# Patient Record
Sex: Male | Born: 1985 | Race: Black or African American | Hispanic: No | Marital: Single | Smoking: Current every day smoker
Health system: Southern US, Community
[De-identification: ages and names within clinical notes are randomized; demographics above are authoritative.]

## PROBLEM LIST (undated history)

## (undated) DIAGNOSIS — I10 Essential (primary) hypertension: Secondary | ICD-10-CM

## (undated) DIAGNOSIS — J45909 Unspecified asthma, uncomplicated: Secondary | ICD-10-CM

---
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
## 2013-12-22 ENCOUNTER — Emergency Department (HOSPITAL_COMMUNITY)
Admission: EM | Admit: 2013-12-22 | Discharge: 2013-12-22 | Disposition: A | Discharge status: Home / Self Care | Payer: Self-pay | Attending: Emergency Medicine | Admitting: Emergency Medicine

## 2013-12-22 ENCOUNTER — Encounter (HOSPITAL_COMMUNITY): Payer: Self-pay | Admitting: Emergency Medicine

## 2013-12-22 DIAGNOSIS — H538 Other visual disturbances: Secondary | ICD-10-CM | POA: Insufficient documentation

## 2013-12-22 DIAGNOSIS — H53149 Visual discomfort, unspecified: Secondary | ICD-10-CM | POA: Insufficient documentation

## 2013-12-22 DIAGNOSIS — H5710 Ocular pain, unspecified eye: Secondary | ICD-10-CM | POA: Insufficient documentation

## 2013-12-22 DIAGNOSIS — I10 Essential (primary) hypertension: Secondary | ICD-10-CM | POA: Insufficient documentation

## 2013-12-22 DIAGNOSIS — H5789 Other specified disorders of eye and adnexa: Secondary | ICD-10-CM | POA: Insufficient documentation

## 2013-12-22 DIAGNOSIS — F17200 Nicotine dependence, unspecified, uncomplicated: Secondary | ICD-10-CM | POA: Insufficient documentation

## 2013-12-22 DIAGNOSIS — J45909 Unspecified asthma, uncomplicated: Secondary | ICD-10-CM | POA: Insufficient documentation

## 2013-12-22 HISTORY — DX: Unspecified asthma, uncomplicated: J45.909

## 2013-12-22 HISTORY — DX: Essential (primary) hypertension: I10

## 2013-12-22 MED ORDER — HYDROCODONE-ACETAMINOPHEN 5-325 MG PO TABS
1.0000 | ORAL_TABLET | Freq: Once | ORAL | Status: AC
  Administered 2013-12-22: 1 via ORAL
  Filled 2013-12-22: qty 1

## 2013-12-22 MED ORDER — HYDROCODONE-ACETAMINOPHEN 5-325 MG PO TABS: ORAL_TABLET | ORAL | Status: DC

## 2013-12-22 MED ORDER — FLUORESCEIN SODIUM 1 MG OP STRP
1.0000 | ORAL_STRIP | Freq: Once | OPHTHALMIC | Status: AC
  Administered 2013-12-22: 1 via OPHTHALMIC
  Filled 2013-12-22: qty 1

## 2013-12-22 MED ORDER — ERYTHROMYCIN 5 MG/GM OP OINT
TOPICAL_OINTMENT | Freq: Once | OPHTHALMIC | Status: AC
  Administered 2013-12-22: 21:00:00 via OPHTHALMIC
  Filled 2013-12-22: qty 1

## 2013-12-22 MED ORDER — TETRACAINE HCL 0.5 % OP SOLN
1.0000 [drp] | Freq: Once | OPHTHALMIC | Status: AC
  Administered 2013-12-22: 1 [drp] via OPHTHALMIC
  Filled 2013-12-22: qty 2

## 2013-12-22 NOTE — Discharge Instructions (Signed)
Please read and follow all provided instructions.  Your diagnoses today include:  1. Eye pain    Tests performed today include:  Visual acuity testing to check your vision  Fluorescein dye examination to look for scratches on your eye  Tonometry to check the pressure inside of your eye  Vital signs. See below for your results today.   Medications prescribed:   Vicodin (hydrocodone/acetaminophen) - narcotic pain medication  DO NOT drive or perform any activities that require you to be awake and alert because this medicine can make you drowsy. BE VERY CAREFUL not to take multiple medicines containing Tylenol (also called acetaminophen). Doing so can lead to an overdose which can damage your liver and cause liver failure and possibly death.   Erythromycin  - antibiotic eye ointment  Use this medication as follows:  Apply 1/4" of the antibiotic ointment to affected eye up to 6 times a day while awake for 7 days  Take any prescribed medications only as directed.  Home care instructions:  Follow any educational materials contained in this packet. If you wear contact lenses, do not use them until your eye caregiver approves. Follow-up care is necessary to be sure the infection is healing if not completely resolved in 2-3 days. See your caregiver or eye specialist as suggested for followup.   If you have an eye infection, wash your hands often as this is very contagious and is easily spread from person to person.   Follow-up instructions: Please follow-up the opthalmologist listed tomorrow for further evaluation of your symptoms.  If you do not have a primary care doctor -- see below for referral information.   Return instructions:   Please return to the Emergency Department if you experience worsening symptoms.   Please return immediately if you develop severe pain, pus drainage, new change in vision, or fever.  Please return if you have any other emergent  concerns.  Additional Information:  Your vital signs today were: BP 173/121   Pulse 109   Temp(Src) 97.8 F (36.6 C) (Oral)   Resp 20   Wt 352 lb 8 oz (159.893 kg)   SpO2 97% If your blood pressure (BP) was elevated above 135/85 this visit, please have this repeated by your doctor within one month. ---------------

## 2013-12-22 NOTE — ED Notes (Signed)
C/o L eye pain and blurred vision since yesterday.

## 2013-12-22 NOTE — ED Provider Notes (Signed)
CSN: [id number]     Arrival date & time 12/22/13  1942 History  This chart was scribed for non-physician practitioner Rhea Bleacher, PA working with Layla Maw Ward, DO by Elveria Rising, ED Scribe. This patient was seen in room TR04C/TR04C and the patient's care was started at 8:14 PM.   Chief Complaint  Patient presents with  . Eye Pain   Patient is a 28 y.o. male presenting with eye pain. The history is provided by the patient. No language interpreter was used.  Eye Pain Pertinent negatives include no chest pain, no abdominal pain and no headaches.  Eye Pain Pertinent negatives include no abdominal pain, chest pain, coughing, fever, headaches, myalgias, nausea, rash, sore throat or vomiting.   HPI Comments: DVID PENDRY is a 28 y.o. male who presents to the Emergency Department complaining of sudden left eye pain and redness, onset 2 days ago. Patient noticed pain upon waking up 2 days ago, but says he was experiencing minimal pain before before bed. Patient has been using Clear Eyes to reduce eye redness and ibuprofen to manage his pain. Patient admits that pain is resolving. Patient currently complaining of blurred vision, pain around eye and mild light sensitivity. No history of autoimmune disease.   Past Medical History  Diagnosis Date  . Asthma   . Hypertension    History reviewed. No pertinent past surgical history. No family history on file. History  Substance Use Topics  . Smoking status: Current Every Day Smoker  . Smokeless tobacco: Not on file  . Alcohol Use: Yes    Review of Systems  Constitutional: Negative for fever.  HENT: Negative for rhinorrhea and sore throat.   Eyes: Positive for photophobia, pain, discharge, redness and visual disturbance. Negative for itching.  Respiratory: Negative for cough.   Cardiovascular: Negative for chest pain.  Gastrointestinal: Negative for nausea, vomiting, abdominal pain and diarrhea.  Genitourinary: Negative for dysuria.   Musculoskeletal: Negative for myalgias.  Skin: Negative for rash.  Neurological: Negative for headaches.      Allergies  Review of patient's allergies indicates not on file.  Home Medications  No current outpatient prescriptions on file. BP 173/121  Pulse 109  Temp(Src) 97.8 F (36.6 C) (Oral)  Resp 20  Wt 352 lb 8 oz (159.893 kg)  SpO2 97% Physical Exam  Nursing note and vitals reviewed. Constitutional: He is oriented to person, place, and time. He appears well-developed and well-nourished. No distress.  HENT:  Head: Normocephalic and atraumatic.  Eyes: Right eye exhibits no chemosis, no discharge and no exudate. No foreign body present in the right eye. Left eye exhibits chemosis. Left eye exhibits no discharge and no exudate. No foreign body present in the left eye. Right conjunctiva is not injected. Right conjunctiva has no hemorrhage. Left conjunctiva is injected. Left conjunctiva has no hemorrhage. Right eye exhibits normal extraocular motion. Left eye exhibits normal extraocular motion. Right pupil is round and reactive. Left pupil is round and reactive. Pupils are equal.  Slit lamp exam:      The left eye shows no corneal abrasion, no corneal ulcer, no foreign body, no fluorescein uptake and no anterior chamber bulge.    Left eye: Severe conjunctival injection.   Ecchymosis laterally.  Neck: Neck supple. No tracheal deviation present.  Cardiovascular: Normal rate.   Pulmonary/Chest: Effort normal. No respiratory distress.  Musculoskeletal: Normal range of motion.  Neurological: He is alert and oriented to person, place, and time.  Skin: Skin is warm and dry.  Psychiatric: He has a normal mood and affect. His behavior is normal.    ED Course  Procedures (including critical care time) DIAGNOSTIC STUDIES: Oxygen Saturation is 97% on room air, normal by my interpretation.    COORDINATION OF CARE: 8:20 PM- Pt advised of plan for treatment and pt agrees. He has no  allergies.   8:48 PM - Patient denies causative events to onset. Patient being discharged with medication to manage pain. Patient advised to follow up with eye doctor tomorrow.   Labs Review Labs Reviewed - No data to display Imaging Review No results found.  EKG Interpretation   None      Vital signs reviewed and are as follows: Filed Vitals:   12/22/13 2135  BP: 147/87  Pulse: 93  Temp:   Resp: 20   Two drops of tetracaine/proparacaine instilled into affected eye.   Fluorescein strip applied to affected eye. Wood's lamp used to assess for corneal abrasion. No corneal abrasion identified. No foreign bodies noted. No visible hyphema.   Tonometry performed. Left eye pressure: 18, 17  Patient tolerated procedure well without immediate complication.   Patient d/w Dr. Elesa MassedWard. Plan reviewed.   Feel patient is safe for discharge to home with ophthalmology followup tomorrow. Prior to discharge, I sat with patient and explained that while this may be a severe conjunctivitis, scleritis remains in the differential and this would require steroid treatment and close ophthalmology followup. Patient verbalizes understanding and states that he will followup tomorrow.  Patient counseled on use of narcotic pain medications. Counseled not to combine these medications with others containing tylenol. Urged not to drink alcohol, drive, or perform any other activities that requires focus while taking these medications. The patient verbalizes understanding and agrees with the plan.  Patient urged to return with worsening symptoms or other concerns. Patient verbalized understanding and agrees with plan.    MDM   Final diagnoses:  Eye pain   Patient with painful red eye, chemosis. Vision is not severely affected. Possible conjunctivitis vs scleritis. Episcleritis felt less likely (patient describes significant pain). No foreign bodies noted. No surrounding erythema, swelling, vision changes/loss  suspicious for orbital or periorbital cellulitis. No signs of iritis. No signs of glaucoma, intraocular pressures normal. No symptoms of retinal detachment. Feel patient needs urgent ophthalmology followup and referrals given.  I personally performed the services described in this documentation, which was scribed in my presence. The recorded information has been reviewed and is accurate.    Renne CriglerJoshua Orena Cavazos, PA-C 12/22/13 2225

## 2013-12-23 NOTE — ED Provider Notes (Signed)
Medical screening examination/treatment/procedure(s) were performed by non-physician practitioner and as supervising physician I was immediately available for consultation/collaboration.  EKG Interpretation   None         Kalia Vahey N Makalyn Lennox, DO 12/23/13 0222 

## 2019-02-22 ENCOUNTER — Emergency Department (HOSPITAL_COMMUNITY): Payer: Self-pay

## 2019-02-22 ENCOUNTER — Encounter (HOSPITAL_COMMUNITY): Payer: Self-pay | Admitting: Emergency Medicine

## 2019-02-22 ENCOUNTER — Emergency Department (HOSPITAL_COMMUNITY)
Admission: EM | Admit: 2019-02-22 | Discharge: 2019-02-22 | Disposition: A | Discharge status: Home / Self Care | Payer: Self-pay | Attending: Emergency Medicine | Admitting: Emergency Medicine

## 2019-02-22 DIAGNOSIS — K122 Cellulitis and abscess of mouth: Secondary | ICD-10-CM | POA: Insufficient documentation

## 2019-02-22 DIAGNOSIS — F17200 Nicotine dependence, unspecified, uncomplicated: Secondary | ICD-10-CM | POA: Insufficient documentation

## 2019-02-22 DIAGNOSIS — J45909 Unspecified asthma, uncomplicated: Secondary | ICD-10-CM | POA: Insufficient documentation

## 2019-02-22 DIAGNOSIS — Z7984 Long term (current) use of oral hypoglycemic drugs: Secondary | ICD-10-CM | POA: Insufficient documentation

## 2019-02-22 DIAGNOSIS — I10 Essential (primary) hypertension: Secondary | ICD-10-CM | POA: Insufficient documentation

## 2019-02-22 DIAGNOSIS — F1410 Cocaine abuse, uncomplicated: Secondary | ICD-10-CM | POA: Insufficient documentation

## 2019-02-22 DIAGNOSIS — Z79899 Other long term (current) drug therapy: Secondary | ICD-10-CM | POA: Insufficient documentation

## 2019-02-22 LAB — I-STAT CREATININE, ED: Creatinine, Ser: 0.7 mg/dL (ref 0.61–1.24)

## 2019-02-22 LAB — COMPREHENSIVE METABOLIC PANEL
ALT: 27 U/L (ref 0–44)
AST: 17 U/L (ref 15–41)
Albumin: 4 g/dL (ref 3.5–5.0)
Alkaline Phosphatase: 88 U/L (ref 38–126)
Anion gap: 14 (ref 5–15)
BUN: 12 mg/dL (ref 6–20)
CO2: 22 mmol/L (ref 22–32)
Calcium: 9.3 mg/dL (ref 8.9–10.3)
Chloride: 99 mmol/L (ref 98–111)
Creatinine, Ser: 0.77 mg/dL (ref 0.61–1.24)
GFR calc Af Amer: >60 mL/min (ref >60)
GFR calc non Af Amer: >60 mL/min (ref >60)
Glucose, Bld: 309 mg/dL — ABNORMAL HIGH (ref 70–99)
Potassium: 3.7 mmol/L (ref 3.5–5.1)
Sodium: 135 mmol/L (ref 135–145)
Total Bilirubin: 1.7 mg/dL — ABNORMAL HIGH (ref 0.3–1.2)
Total Protein: 8.1 g/dL (ref 6.5–8.1)

## 2019-02-22 LAB — CBC WITH DIFFERENTIAL/PLATELET
Abs Immature Granulocytes: 0.06 10*3/uL (ref 0.00–0.07)
Basophils Absolute: 0.1 10*3/uL (ref 0.0–0.1)
Basophils Relative: 1 %
Eosinophils Absolute: 0 10*3/uL (ref 0.0–0.5)
Eosinophils Relative: 0 %
HCT: 42.6 % (ref 39.0–52.0)
Hemoglobin: 14.7 g/dL (ref 13.0–17.0)
Immature Granulocytes: 1 %
Lymphocytes Relative: 25 %
Lymphs Abs: 2.1 10*3/uL (ref 0.7–4.0)
MCH: 28.1 pg (ref 26.0–34.0)
MCHC: 34.5 g/dL (ref 30.0–36.0)
MCV: 81.5 fL (ref 80.0–100.0)
Monocytes Absolute: 0.8 10*3/uL (ref 0.1–1.0)
Monocytes Relative: 10 %
Neutro Abs: 5.4 10*3/uL (ref 1.7–7.7)
Neutrophils Relative %: 63 %
Platelets: 258 10*3/uL (ref 150–400)
RBC: 5.23 MIL/uL (ref 4.22–5.81)
RDW: 12.7 % (ref 11.5–15.5)
WBC: 8.5 10*3/uL (ref 4.0–10.5)
nRBC: 0 % (ref 0.0–0.2)

## 2019-02-22 MED ORDER — IBUPROFEN 600 MG PO TABS: 600.0000 mg | ORAL_TABLET | Freq: Four times a day (QID) | ORAL | 0 refills | Status: DC | PRN

## 2019-02-22 MED ORDER — KETOROLAC TROMETHAMINE 15 MG/ML IJ SOLN
30.0000 mg | Freq: Once | INTRAMUSCULAR | Status: AC
  Administered 2019-02-22: 30 mg via INTRAVENOUS
  Filled 2019-02-22: qty 2

## 2019-02-22 MED ORDER — SODIUM CHLORIDE 0.9 % IV SOLN
3.0000 g | Freq: Once | INTRAVENOUS | Status: AC
  Administered 2019-02-22: 3 g via INTRAVENOUS
  Filled 2019-02-22: qty 3

## 2019-02-22 MED ORDER — RACEPINEPHRINE HCL 2.25 % IN NEBU
0.5000 mL | INHALATION_SOLUTION | Freq: Once | RESPIRATORY_TRACT | Status: AC
  Administered 2019-02-22: 0.5 mL via RESPIRATORY_TRACT
  Filled 2019-02-22: qty 0.5

## 2019-02-22 MED ORDER — SODIUM CHLORIDE 0.9 % IV BOLUS
1000.0000 mL | Freq: Once | INTRAVENOUS | Status: AC
  Administered 2019-02-22: 1000 mL via INTRAVENOUS

## 2019-02-22 MED ORDER — IOHEXOL 300 MG/ML  SOLN
75.0000 mL | Freq: Once | INTRAMUSCULAR | Status: AC | PRN
  Administered 2019-02-22: 75 mL via INTRAVENOUS

## 2019-02-22 MED ORDER — DEXAMETHASONE SODIUM PHOSPHATE 10 MG/ML IJ SOLN
10.0000 mg | Freq: Once | INTRAMUSCULAR | Status: AC
  Administered 2019-02-22: 10 mg via INTRAVENOUS
  Filled 2019-02-22: qty 1

## 2019-02-22 MED ORDER — SODIUM CHLORIDE (PF) 0.9 % IJ SOLN
INTRAMUSCULAR | Status: AC
  Filled 2019-02-22: qty 50

## 2019-02-22 MED ORDER — AMOXICILLIN-POT CLAVULANATE 875-125 MG PO TABS: 1.0000 | ORAL_TABLET | Freq: Two times a day (BID) | ORAL | 0 refills | Status: AC

## 2019-02-22 NOTE — ED Provider Notes (Addendum)
Patient here with uvulitis.  Accepted check out from Dr. Erma Heritage.  Plan reevaluate at 6 p with expected discharge 6:58 PM Patient stable breathing normally.  Uvula swollen but surrounding tissue normal Patient voices understanding of return precautions Discharge papers per Dr. Daralene Milch, MD 02/22/19 6606    Margarita Grizzle, MD 02/22/19 1859

## 2019-02-22 NOTE — ED Triage Notes (Signed)
Patient here from home with complaints of choking. States that something is stuck in throat. Reports that he woke up ago and felt a knot in throat. States that he did cocaine last night, has done it before but never felt like this. Patient speaking in complete sentences, making constant gagging sounds in triage.

## 2019-02-22 NOTE — ED Provider Notes (Signed)
[facility]-EMERGENCY DEPT Provider Note   CSN: [id number] Arrival date & time: 02/22/19  1337    History   Chief Complaint Chief Complaint  Patient presents with  . Choking    HPI Nathan Summers is a 33 y.o. male.     The history is provided by the patient.     33 year old male with history of asthma here with choking sensation.  The patient states that he was in his usual state of health last night.  He admits to snorting cocaine but did not smoke anything.  He states that overnight, he began to feel like he was choking on something and that there was something stuck in the back of his throat.  He has had associated pain and difficulty swallowing.  He denies overt shortness of breath, but does feel like he has a fullness in his neck.  Denies any fevers or chills.  He does not believe he swallowed anything or denies any concern for potential foreign body.  He is not eating anything sharp or jagged last night.  Denies any coughing.  Denies any specific recent sick contacts.  No other medical complaints.  Past Medical History:  Diagnosis Date  . Asthma   . Hypertension     There are no active problems to display for this patient.   History reviewed. No pertinent surgical history.      Home Medications    Prior to Admission medications   Medication Sig Start Date End Date Taking? Authorizing Provider  atorvastatin (LIPITOR) 80 MG tablet Take 80 mg by mouth at bedtime. 11/22/18  Yes [provider]  glipiZIDE (GLUCOTROL) 10 MG tablet Take 10 mg by mouth 2 (two) times daily. 11/22/18  Yes [provider]  hydrochlorothiazide (HYDRODIURIL) 25 MG tablet Take 25 mg by mouth daily. 11/22/18  Yes [provider]  lisinopril (ZESTRIL) 20 MG tablet Take 20 mg by mouth daily. 12/05/18  Yes [provider]  metFORMIN (GLUCOPHAGE) 1000 MG tablet Take 1,000 mg by mouth 2 (two) times daily. 11/22/18  Yes [provider]   amoxicillin-clavulanate (AUGMENTIN) 875-125 MG tablet Take 1 tablet by mouth 2 (two) times daily for 10 days. 02/22/19 03/04/19  Shaune Pollack, MD  HYDROcodone-acetaminophen (NORCO/VICODIN) 5-325 MG per tablet Take 1-2 tablets every 6 hours as needed for severe pain Patient not taking: Reported on 02/22/2019 12/22/13   Renne Crigler, PA-C  ibuprofen (ADVIL) 600 MG tablet Take 1 tablet (600 mg total) by mouth every 6 (six) hours as needed for moderate pain. 02/22/19   Shaune Pollack, MD    Family History No family history on file.  Social History Social History   Tobacco Use  . Smoking status: Current Every Day Smoker  . Smokeless tobacco: Never Used  Substance Use Topics  . Alcohol use: Yes  . Drug use: Yes    Types: Marijuana     Allergies   Patient has no known allergies.   Review of Systems Review of Systems  Constitutional: Negative for chills, fatigue and fever.  HENT: Positive for sore throat and trouble swallowing. Negative for congestion and rhinorrhea.   Eyes: Negative for visual disturbance.  Respiratory: Negative for cough, shortness of breath and wheezing.   Cardiovascular: Negative for chest pain and leg swelling.  Gastrointestinal: Negative for abdominal pain, diarrhea, nausea and vomiting.  Genitourinary: Negative for dysuria and flank pain.  Musculoskeletal: Negative for neck pain and neck stiffness.  Skin: Negative for rash and wound.  Allergic/Immunologic:  Negative for immunocompromised state.  Neurological: Negative for syncope, weakness and headaches.  All other systems reviewed and are negative.    Physical Exam Updated Vital Signs BP 139/77   Pulse (!) 105   Temp 98 F (36.7 C) (Oral)   Resp 16   SpO2 98%   Physical Exam Vitals signs and nursing note reviewed.  Constitutional:      General: He is not in acute distress.    Appearance: He is well-developed.  HENT:     Head: Normocephalic and atraumatic.     Comments: 3+ tonsillar swelling  with exudates. Marked uvular edema noted. No pooling of secretions. No inspiratory or expiratory stridor. Eyes:     Conjunctiva/sclera: Conjunctivae normal.  Neck:     Musculoskeletal: Neck supple.  Cardiovascular:     Rate and Rhythm: Normal rate and regular rhythm.     Heart sounds: Normal heart sounds. No murmur. No friction rub.  Pulmonary:     Effort: Pulmonary effort is normal. No respiratory distress.     Breath sounds: Normal breath sounds. No wheezing or rales.  Abdominal:     General: There is no distension.     Palpations: Abdomen is soft.     Tenderness: There is no abdominal tenderness.  Skin:    General: Skin is warm.     Capillary Refill: Capillary refill takes less than 2 seconds.  Neurological:     Mental Status: He is alert and oriented to person, place, and time.     Motor: No abnormal muscle tone.      ED Treatments / Results  Labs (all labs ordered are listed, but only abnormal results are displayed) Labs Reviewed  COMPREHENSIVE METABOLIC PANEL - Abnormal; Notable for the following components:      Result Value   Glucose, Bld 309 (*)    Total Bilirubin 1.7 (*)    All other components within normal limits  CULTURE, GROUP A STREP [facility])  CBC WITH DIFFERENTIAL/PLATELET  I-STAT CREATININE, ED    EKG EKG Interpretation  Date/Time:  Friday February 22 2019 13:49:22 EDT Ventricular Rate:  128 PR Interval:    QRS Duration: 77 QT Interval:  338 QTC Calculation: 494 R Axis:   -20 Text Interpretation:  Sinus tachycardia Probable left atrial enlargement Borderline left axis deviation RSR' in V1 or V2, probably normal variant Consider anterior infarct Prolonged QT interval No old tracing to compare Confirmed by Shaune PollackIsaacs, Ara Grandmaison [id number]) on 02/22/2019 3:16:26 PM   Radiology Dg Chest 2 View  Result Date: 02/22/2019 CLINICAL DATA:  Choking sensation.  Recent cocaine use EXAM: CHEST - 2 VIEW COMPARISON:  None. FINDINGS: Lungs are clear. The heart size and  pulmonary vascularity are normal. No adenopathy. There appears to be narrowing in the lower cervical tracheal air column. No bone lesions. IMPRESSION: Lungs clear. Narrowing in the lower cervical tracheal air column. This is a finding that may be seen with a viral type illness such as croup. No radiopaque foreign body is evident in this area. No adenopathy. Electronically Signed   By: Bretta BangWilliam  Woodruff III M.D.   On: 02/22/2019 15:06   Ct Soft Tissue Neck W Contrast  Result Date: 02/22/2019 CLINICAL DATA:  Choking sensation. Patient feels like something is stuck in the throat. Cocaine use last night. EXAM: CT NECK WITH CONTRAST TECHNIQUE: Multidetector CT imaging of the neck was performed using the standard protocol following the bolus administration of intravenous contrast. CONTRAST:  75mL OMNIPAQUE IOHEXOL 300 MG/ML  SOLN COMPARISON:  None. FINDINGS: Pharynx and larynx: Symmetric pharyngeal soft tissues without evidence of mass or foreign body. Mild prominence of the uvula without evidence of significant swelling/inflammation elsewhere. Widely patent airway. Unremarkable larynx. Salivary glands: No inflammation, mass, or stone. Thyroid: Unremarkable. Lymph nodes: No enlarged or suspicious lymph nodes in the neck. Vascular: Unremarkable on this non angiographic examination. Limited intracranial: Unremarkable. Visualized orbits: Unremarkable. Mastoids and visualized paranasal sinuses: Minimal mucosal thickening in the maxillary sinuses. Clear mastoid air cells. Skeleton: No acute osseous abnormality or suspicious osseous lesion. Upper chest: Clear lung apices. Other: None. IMPRESSION: Possible mild uvular swelling. No other evidence of acute abnormality in the neck. No foreign body. Electronically Signed   By: Sebastian Ache M.D.   On: 02/22/2019 16:27    Procedures Procedures (including critical care time)  Medications Ordered in ED Medications  Racepinephrine HCl 2.25 % nebulizer solution 0.5 mL (0.5 mLs  Nebulization Given 02/22/19 1526)  dexamethasone (DECADRON) injection 10 mg (10 mg Intravenous Given 02/22/19 1540)  sodium chloride 0.9 % bolus 1,000 mL (0 mLs Intravenous Stopped 02/22/19 1858)  iohexol (OMNIPAQUE) 300 MG/ML solution 75 mL (75 mLs Intravenous Contrast Given 02/22/19 1603)  Ampicillin-Sulbactam (UNASYN) 3 g in sodium chloride 0.9 % 100 mL IVPB (0 g Intravenous Stopped 02/22/19 1858)  ketorolac (TORADOL) 15 MG/ML injection 30 mg (30 mg Intravenous Given 02/22/19 1706)     Initial Impression / Assessment and Plan / ED Course  I have reviewed the triage vital signs and the nursing notes.  Pertinent labs & imaging results that were available during my care of the patient were reviewed by me and considered in my medical decision making (see chart for details).        33 yo M here with sore throat, reported difficulty/pain swallowing after using cocaine last night. On exam, pt has pharyngitis w/ uvulitis. No stridor or signs of airway compromise but given degree of discomfort, pt given empiric racemic epi and sent for stat CT Neck. Fortunately, CT neck is c/w uvulitis and shows no signs of epiglottitis, RPA, or deeper airway compromise. Pt feeling much better after decadron and racemic epi. Plan to monitor x 3 hours after epi, d/c if improved.   Final Clinical Impressions(s) / ED Diagnoses   Final diagnoses:  Uvulitis  Cocaine abuse [facility])    ED Discharge Orders         Ordered    amoxicillin-clavulanate (AUGMENTIN) 875-125 MG tablet  2 times daily     02/22/19 1710    ibuprofen (ADVIL) 600 MG tablet  Every 6 hours PRN     02/22/19 1710           Shaune Pollack, MD 02/23/19 [id number]

## 2019-02-22 NOTE — ED Notes (Signed)
Pt states no difficulty swalling

## 2019-02-22 NOTE — Discharge Instructions (Signed)
If you notice difficulty breathing or worsening swelling in your mouth, difficulty swallowing, or other concerning symptoms, return to the ER immediately

## 2019-02-25 LAB — CULTURE, GROUP A STREP (THRC)

## 2020-10-05 IMAGING — CT CT NECK WITH CONTRAST
4 of 5 series · 16 of 33 positions shown, 18 images · IV contrast (omnipaque)
Comparison: None.

CLINICAL DATA: Choking sensation. Patient feels like something is
stuck in the throat. Cocaine use last night.

EXAM:
CT NECK WITH CONTRAST
TECHNIQUE: Multidetector CT imaging of the neck was performed using the
standard protocol following the bolus administration of intravenous
contrast.
CONTRAST:  75mL OMNIPAQUE IOHEXOL 300 MG/ML  SOLN

[Series 3: axial neck · axial · 0.44mm/px · z∈[-360,-210]mm · 4 of 126 slices shown, 5 images]
[im 26/126  soft-tissue]
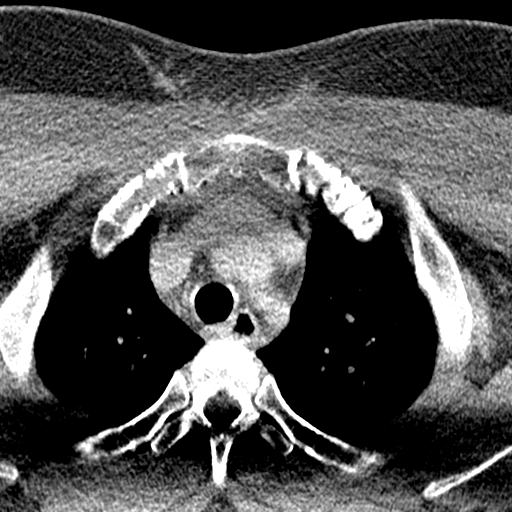
[im 26/126  bone]
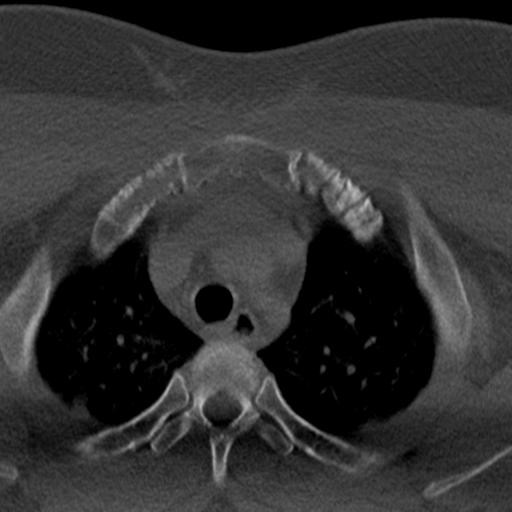
[im 51/126  bone]
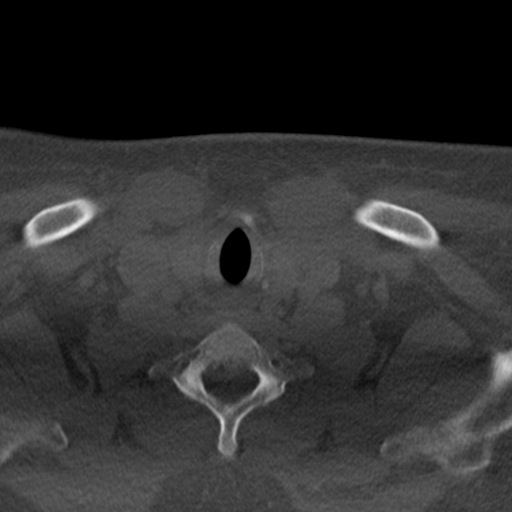
[im 76/126  bone]
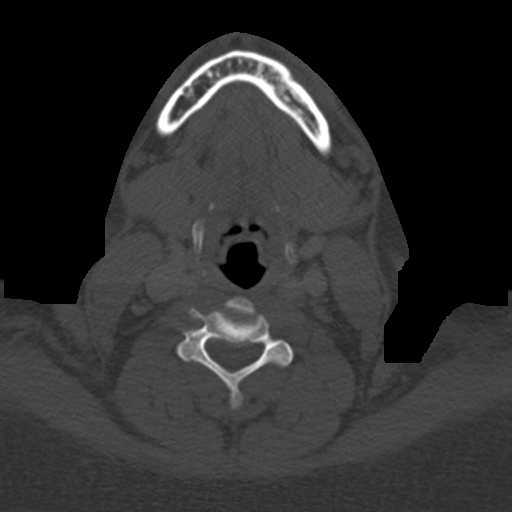
[im 101/126  bone]
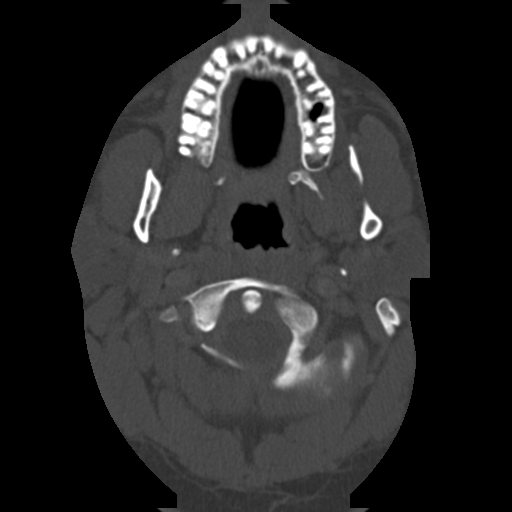

[Series 5: ax · axial · 0.40mm/px · z∈[-379,-226]mm · 4 of 130 slices shown]
[im 26/130  bone]
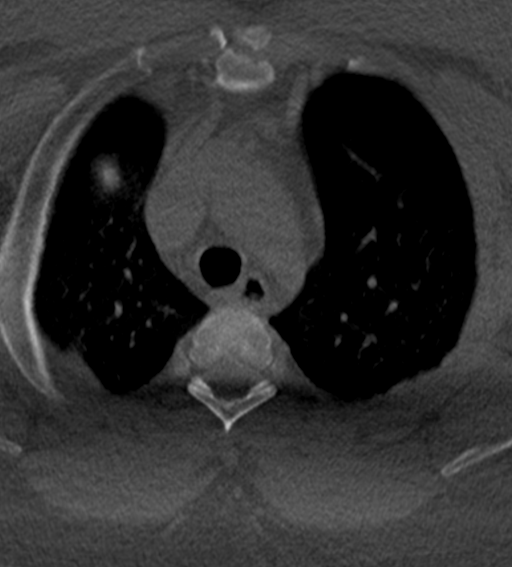
[im 52/130  bone]
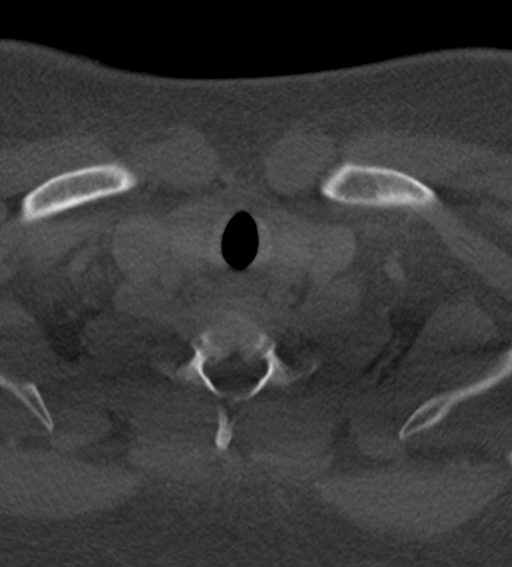
[im 78/130  bone]
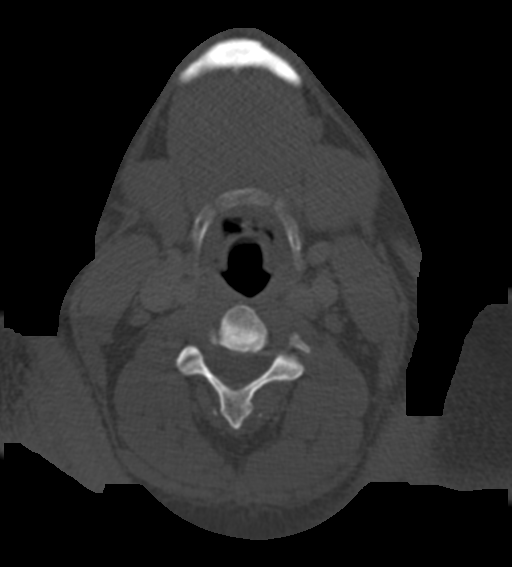
[im 104/130  bone]
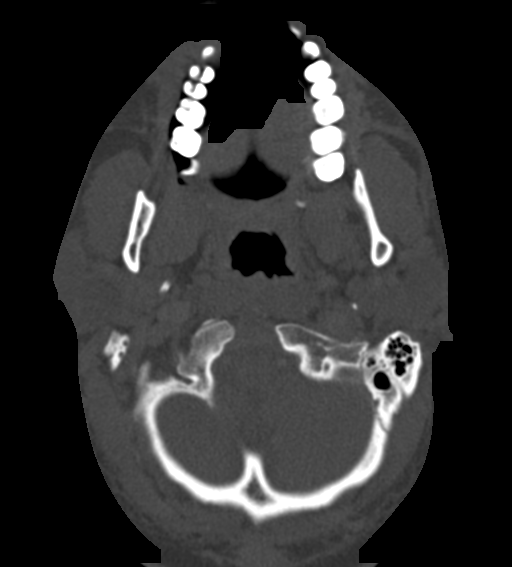

[Series 6: cor neck · coronal · 0.53mm/px · 3 of 117 slices shown]
[im 27/117  bone]
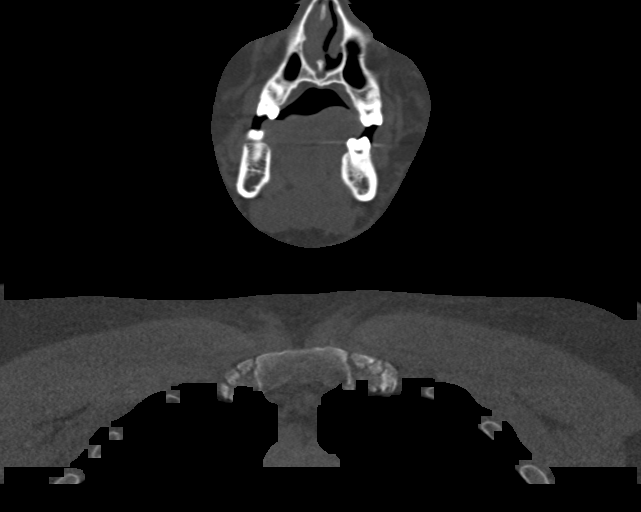
[im 48/117  bone]
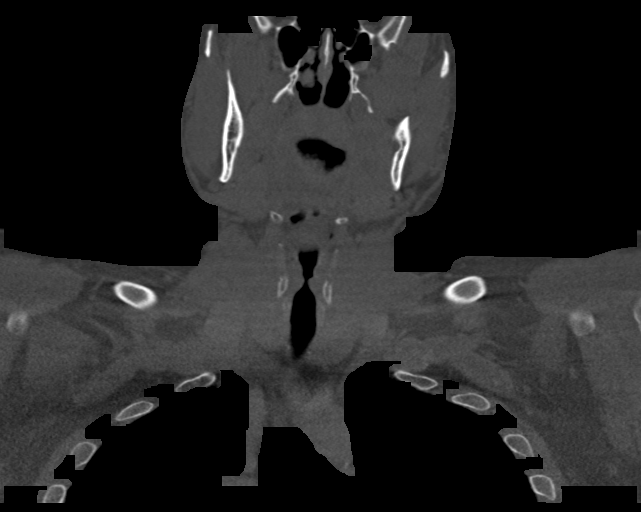
[im 69/117  bone]
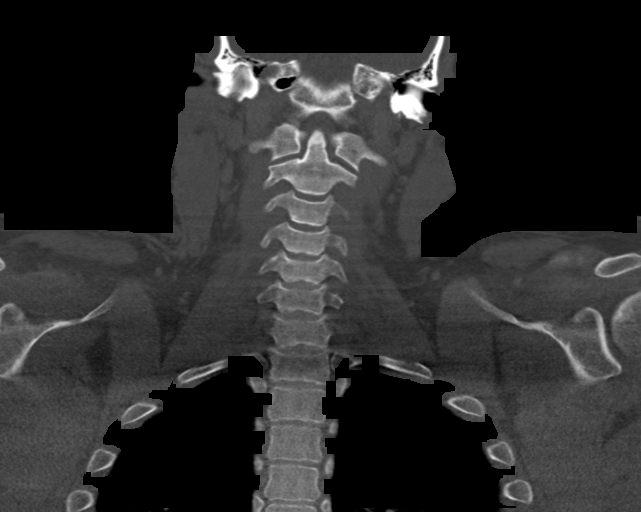

[Series 7: sag neck · sagittal · 0.45mm/px · 5 of 103 slices shown, 6 images]
[im 35/103  bone]
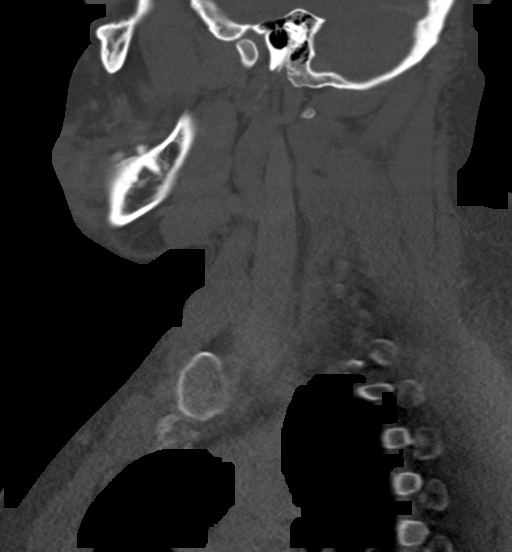
[im 43/103  bone]
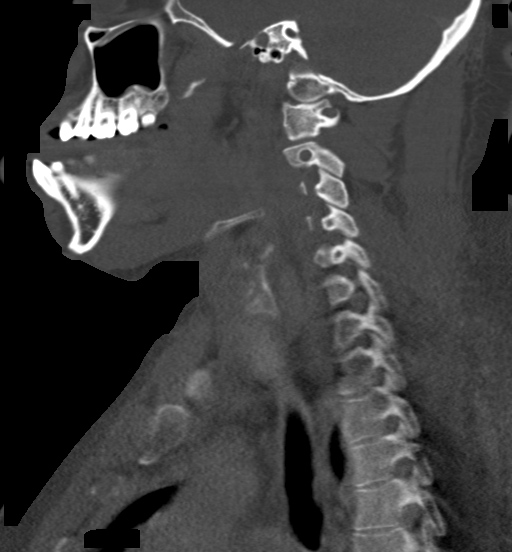
[im 52/103  soft-tissue]
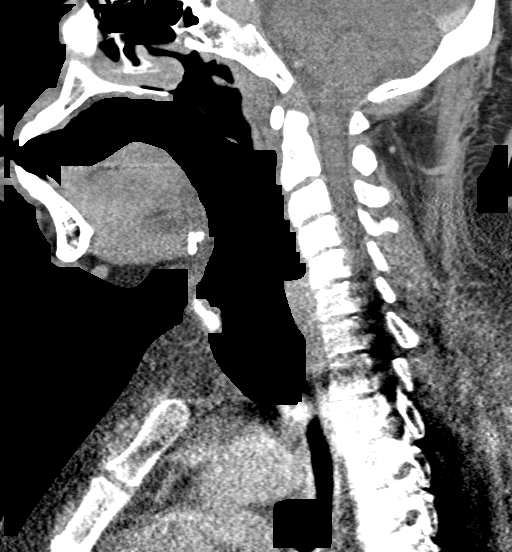
[im 52/103  bone]
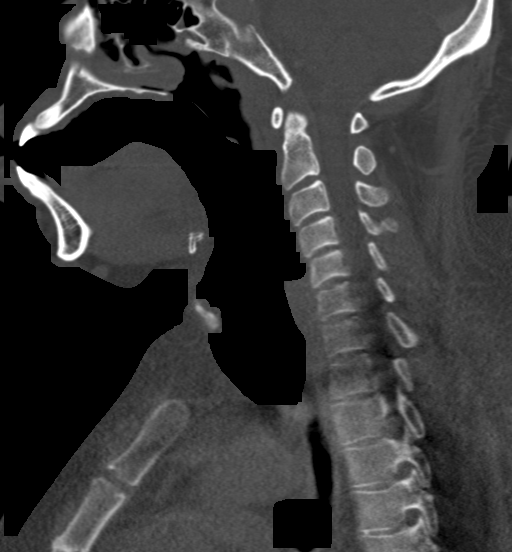
[im 60/103  bone]
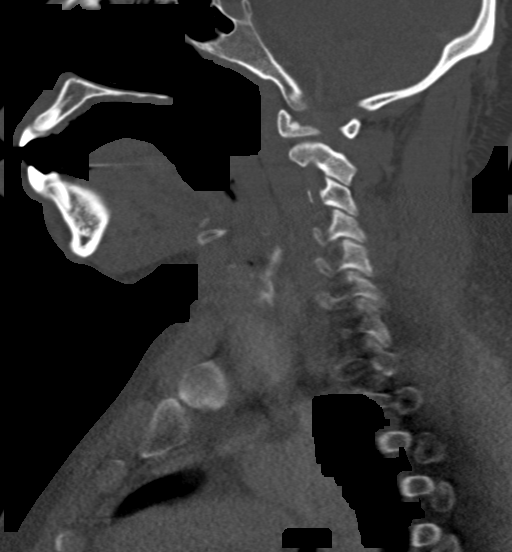
[im 69/103  bone]
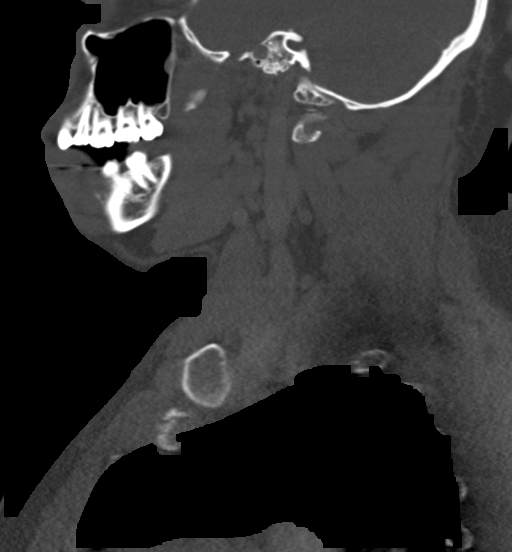

[16 of 33 positions shown; findings below may reference images not displayed]

FINDINGS: Pharynx and larynx: Symmetric pharyngeal soft tissues without
evidence of mass or foreign body. Mild prominence of the uvula
without evidence of significant swelling/inflammation elsewhere.
Widely patent airway. Unremarkable larynx.

Salivary glands: No inflammation, mass, or stone.

Thyroid: Unremarkable.

Lymph nodes: No enlarged or suspicious lymph nodes in the neck.

Vascular: Unremarkable on this non angiographic examination.

Limited intracranial: Unremarkable.

Visualized orbits: Unremarkable.

Mastoids and visualized paranasal sinuses: Minimal mucosal
thickening in the maxillary sinuses. Clear mastoid air cells.

Skeleton: No acute osseous abnormality or suspicious osseous lesion.

Upper chest: Clear lung apices.

Other: None.
IMPRESSION: Possible mild uvular swelling. No other evidence of acute
abnormality in the neck. No foreign body.

## 2021-08-09 ENCOUNTER — Other Ambulatory Visit: Payer: Self-pay

## 2021-08-09 ENCOUNTER — Ambulatory Visit (HOSPITAL_COMMUNITY)
Admission: EM | Admit: 2021-08-09 | Discharge: 2021-08-09 | Disposition: A | Discharge status: Home / Self Care | Payer: Self-pay | Attending: Physician Assistant | Admitting: Physician Assistant

## 2021-08-09 ENCOUNTER — Ambulatory Visit (INDEPENDENT_AMBULATORY_CARE_PROVIDER_SITE_OTHER): Payer: Self-pay

## 2021-08-09 ENCOUNTER — Encounter (HOSPITAL_COMMUNITY): Payer: Self-pay | Admitting: Emergency Medicine

## 2021-08-09 DIAGNOSIS — R5383 Other fatigue: Secondary | ICD-10-CM

## 2021-08-09 DIAGNOSIS — R531 Weakness: Secondary | ICD-10-CM | POA: Insufficient documentation

## 2021-08-09 DIAGNOSIS — E1165 Type 2 diabetes mellitus with hyperglycemia: Secondary | ICD-10-CM | POA: Insufficient documentation

## 2021-08-09 DIAGNOSIS — Z7984 Long term (current) use of oral hypoglycemic drugs: Secondary | ICD-10-CM

## 2021-08-09 DIAGNOSIS — R0602 Shortness of breath: Secondary | ICD-10-CM

## 2021-08-09 DIAGNOSIS — R52 Pain, unspecified: Secondary | ICD-10-CM | POA: Insufficient documentation

## 2021-08-09 LAB — POCT URINALYSIS DIPSTICK, ED / UC
Bilirubin Urine: NEGATIVE
Glucose, UA: >=1000 mg/dL — AB
Hgb urine dipstick: NEGATIVE
Ketones, ur: NEGATIVE mg/dL
Nitrite: NEGATIVE
Protein, ur: NEGATIVE mg/dL
Specific Gravity, Urine: 1.015 (ref 1.005–1.030)
Urobilinogen, UA: 0.2 mg/dL (ref 0.0–1.0)
pH: 5.5 (ref 5.0–8.0)

## 2021-08-09 LAB — COMPREHENSIVE METABOLIC PANEL
ALT: 21 U/L (ref 0–44)
AST: 15 U/L (ref 15–41)
Albumin: 4.1 g/dL (ref 3.5–5.0)
Alkaline Phosphatase: 97 U/L (ref 38–126)
Anion gap: 10 (ref 5–15)
BUN: 17 mg/dL (ref 6–20)
CO2: 30 mmol/L (ref 22–32)
Calcium: 10.1 mg/dL (ref 8.9–10.3)
Chloride: 92 mmol/L — ABNORMAL LOW (ref 98–111)
Creatinine, Ser: 0.97 mg/dL (ref 0.61–1.24)
GFR, Estimated: >60 mL/min (ref >60)
Glucose, Bld: 347 mg/dL — ABNORMAL HIGH (ref 70–99)
Potassium: 5.2 mmol/L — ABNORMAL HIGH (ref 3.5–5.1)
Sodium: 132 mmol/L — ABNORMAL LOW (ref 135–145)
Total Bilirubin: 0.9 mg/dL (ref 0.3–1.2)
Total Protein: 8.4 g/dL — ABNORMAL HIGH (ref 6.5–8.1)

## 2021-08-09 LAB — CBC WITH DIFFERENTIAL/PLATELET
Abs Immature Granulocytes: 0.06 10*3/uL (ref 0.00–0.07)
Basophils Absolute: 0.1 10*3/uL (ref 0.0–0.1)
Basophils Relative: 1 %
Eosinophils Absolute: 0.3 10*3/uL (ref 0.0–0.5)
Eosinophils Relative: 3 %
HCT: 45.8 % (ref 39.0–52.0)
Hemoglobin: 15.8 g/dL (ref 13.0–17.0)
Immature Granulocytes: 1 %
Lymphocytes Relative: 42 %
Lymphs Abs: 3.4 10*3/uL (ref 0.7–4.0)
MCH: 28.9 pg (ref 26.0–34.0)
MCHC: 34.5 g/dL (ref 30.0–36.0)
MCV: 83.9 fL (ref 80.0–100.0)
Monocytes Absolute: 0.8 10*3/uL (ref 0.1–1.0)
Monocytes Relative: 11 %
Neutro Abs: 3.3 10*3/uL (ref 1.7–7.7)
Neutrophils Relative %: 42 %
Platelets: 350 10*3/uL (ref 150–400)
RBC: 5.46 MIL/uL (ref 4.22–5.81)
RDW: 13.2 % (ref 11.5–15.5)
WBC: 7.9 10*3/uL (ref 4.0–10.5)
nRBC: 0 % (ref 0.0–0.2)

## 2021-08-09 LAB — CBG MONITORING, ED: Glucose-Capillary: 336 mg/dL — ABNORMAL HIGH (ref 70–99)

## 2021-08-09 LAB — CK: Total CK: 91 U/L (ref 49–397)

## 2021-08-09 MED ORDER — TIZANIDINE HCL 4 MG PO CAPS: 4.0000 mg | ORAL_CAPSULE | Freq: Three times a day (TID) | ORAL | 0 refills | Status: AC

## 2021-08-09 MED ORDER — IBUPROFEN 600 MG PO TABS: 600.0000 mg | ORAL_TABLET | Freq: Four times a day (QID) | ORAL | 0 refills | Status: AC | PRN

## 2021-08-09 MED ORDER — GLIPIZIDE 10 MG PO TABS: 10.0000 mg | ORAL_TABLET | Freq: Two times a day (BID) | ORAL | 0 refills | Status: AC

## 2021-08-09 MED ORDER — METFORMIN HCL 1000 MG PO TABS: 1000.0000 mg | ORAL_TABLET | Freq: Two times a day (BID) | ORAL | 0 refills | Status: AC

## 2021-08-09 MED ORDER — LISINOPRIL 20 MG PO TABS: 20.0000 mg | ORAL_TABLET | Freq: Every day | ORAL | 0 refills | Status: AC

## 2021-08-09 NOTE — Discharge Instructions (Signed)
I am unsure what is causing your symptoms as her blood work is pending.  It does appear that your diabetes is not well controlled which could be contributing to some of your fatigue.  It is very important that you take your blood sugar on a regular basis and keep a log for evaluation of follow-up appointment and take all medications as directed.  Follow-up with your primary care provider as soon as possible.  If you develop increased shortness of breath, nausea/vomiting, worsening fatigue you need to go to the emergency room for further evaluation.  I did call in some medications to help with the body aches including ibuprofen and a muscle relaxer.  Do not take additional NSAIDs including aspirin, ibuprofen/Advil, naproxen/Aleve with this medication as it can cause stomach bleeding.  Do not drive or drink alcohol while taking tizanidine as this can make you sleepy.  Make sure you are drinking plenty of fluid including electrolyte solutions.  We will contact you if your blood work is abnormal.  As we discussed, follow-up with your primary care provider soon as possible and go to the emergency room if your symptoms are not improving or if you have any worsening symptoms.

## 2021-08-09 NOTE — ED Triage Notes (Signed)
C/o fatigue, body aches, intermittent runny nose nausea for a week.

## 2021-08-09 NOTE — ED Provider Notes (Signed)
[facility]    CSN: [id number] Arrival date & time: 08/09/21  1530      History   Chief Complaint Chief Complaint  Patient presents with   Fatigue   Generalized Body Aches    HPI Nathan Summers is a 35 y.o. male.   Patient presents today with a several week history of mild body aches that have worsened in the past week.  He denies any URI symptoms including runny nose, cough, sore throat.  He reports it is that he has the flu without the congestion symptoms.  He reports widespread body aches that are rated 7 on a 0-10 pain scale, generalized throughout body, described as aching with periodic sharp pains with movement, no alleviating factors identified.  He has tried over-the-counter medications without improvement of symptoms.  Reports loose stools and nausea but denies any vomiting or abdominal pain.  Denies any known sick contacts but does work as a [employer] fast [employer]er for [employer] so is exposed to many people.  He has had COVID-19 vaccination but is not had COVID in the past.  He did take an at-home COVID test that was negative several days ago.  He has a history of asthma and has been experiencing more shortness of breath but has not used albuterol inhaler.  Reports feeling very fatigued and rundown to the point that he cannot go to work.  He denies any recent change to activity.  He does report some urinary discoloration but describes this as a bright yellow.   Past Medical History:  Diagnosis Date   Asthma    Hypertension     There are no problems to display for this patient.   History reviewed. No pertinent surgical history.     Home Medications    Prior to Admission medications   Medication Sig Start Date End Date Taking? Authorizing Provider  tiZANidine (ZANAFLEX) 4 MG capsule Take 1 capsule (4 mg total) by mouth 3 (three) times daily. 08/09/21  Yes Shakora Nordquist K, PA-C  atorvastatin (LIPITOR) 80 MG tablet Take 80 mg by mouth at bedtime. 11/22/18   [provider]  glipiZIDE (GLUCOTROL) 10 MG tablet Take 1 tablet (10 mg total) by mouth 2 (two) times daily. 08/09/21   Hanako Tipping, Noberto Retort, PA-C  hydrochlorothiazide (HYDRODIURIL) 25 MG tablet Take 25 mg by mouth daily. 11/22/18   [provider]  ibuprofen (ADVIL) 600 MG tablet Take 1 tablet (600 mg total) by mouth every 6 (six) hours as needed for moderate pain. 08/09/21   Elin Seats K, PA-C  lisinopril (ZESTRIL) 20 MG tablet Take 1 tablet (20 mg total) by mouth daily. 08/09/21   Ramia Sidney, Noberto Retort, PA-C  metFORMIN (GLUCOPHAGE) 1000 MG tablet Take 1 tablet (1,000 mg total) by mouth 2 (two) times daily. 08/09/21   Zymiere Trostle, Noberto Retort, PA-C    Family History History reviewed. No pertinent family history.  Social History Social History   Tobacco Use   Smoking status: Every Day   Smokeless tobacco: Never  Substance Use Topics   Alcohol use: Yes   Drug use: Yes    Types: Marijuana     Allergies   Patient has no known allergies.   Review of Systems Review of Systems  Constitutional:  Positive for activity change and fatigue. Negative for appetite change and fever.  HENT:  Negative for congestion, sinus pressure, sneezing and sore throat.   Respiratory:  Positive for shortness of breath. Negative for cough.   Cardiovascular:  Negative for  chest pain and palpitations.  Gastrointestinal:  Positive for nausea. Negative for abdominal pain, diarrhea and vomiting.  Genitourinary:  Negative for dysuria and urgency.  Musculoskeletal:  Positive for arthralgias and myalgias.  Neurological:  Positive for weakness (generalized). Negative for dizziness, light-headedness, numbness and headaches.    Physical Exam Triage Vital Signs ED Triage Vitals  Enc Vitals Group     BP 08/09/21 1738 (!) 128/92     Pulse Rate 08/09/21 1738 (!) 109     Resp 08/09/21 1738 18     Temp 08/09/21 1738 98.6 F (37 C)     Temp Source 08/09/21 1738 Oral     SpO2 08/09/21 1738 98 %     Weight --      Height --       Head Circumference --      Peak Flow --      Pain Score 08/09/21 1735 7     Pain Loc --      Pain Edu? --      Excl. in GC? --    No data found.  Updated Vital Signs BP (!) 128/92 (BP Location: Right Arm)   Pulse (!) 109   Temp 98.6 F (37 C) (Oral)   Resp 18   SpO2 98%   Visual Acuity Right Eye Distance:   Left Eye Distance:   Bilateral Distance:    Right Eye Near:   Left Eye Near:    Bilateral Near:     Physical Exam Vitals reviewed.  Constitutional:      General: He is awake.     Appearance: Normal appearance. He is well-developed. He is not ill-appearing.     Comments: Very pleasant male appears stated age no acute distress sitting comfortably in exam room  HENT:     Head: Normocephalic and atraumatic.     Right Ear: Tympanic membrane, ear canal and external ear normal. Tympanic membrane is not erythematous or bulging.     Left Ear: Tympanic membrane, ear canal and external ear normal. Tympanic membrane is not erythematous or bulging.     Nose: Nose normal.     Mouth/Throat:     Pharynx: Uvula midline. No oropharyngeal exudate or posterior oropharyngeal erythema.  Cardiovascular:     Rate and Rhythm: Regular rhythm. Tachycardia present.     Heart sounds: Normal heart sounds, S1 normal and S2 normal. No murmur heard. Pulmonary:     Effort: Pulmonary effort is normal. No accessory muscle usage or respiratory distress.     Breath sounds: Normal breath sounds. No stridor. No wheezing, rhonchi or rales.     Comments: Clear to auscultation bilaterally Abdominal:     General: Bowel sounds are normal.     Palpations: Abdomen is soft.     Tenderness: There is no abdominal tenderness.     Comments: Benign abdominal exam  Musculoskeletal:     Cervical back: Normal range of motion and neck supple.     Comments: Strength 4/5 bilateral upper and lower extremities.  Discomfort with palpation over major muscle groups.  Neurological:     Mental Status: He is alert.   Psychiatric:        Behavior: Behavior is cooperative.     UC Treatments / Results  Labs (all labs ordered are listed, but only abnormal results are displayed) Labs Reviewed  POCT URINALYSIS DIPSTICK, ED / UC - Abnormal; Notable for the following components:      Result Value   Glucose, UA >=1000 (*)  Leukocytes,Ua SMALL (*)    All other components within normal limits  CBG MONITORING, ED - Abnormal; Notable for the following components:   Glucose-Capillary 336 (*)    All other components within normal limits  URINE CULTURE  CBC WITH DIFFERENTIAL/PLATELET  COMPREHENSIVE METABOLIC PANEL  CK    EKG   Radiology DG Chest 2 View  Result Date: 08/09/2021 CLINICAL DATA:  Shortness of breath, fatigue EXAM: CHEST - 2 VIEW COMPARISON:  02/22/2019 FINDINGS: The heart size and mediastinal contours are within normal limits. No focal airspace consolidation, pleural effusion, or pneumothorax. The visualized skeletal structures are unremarkable. IMPRESSION: No active cardiopulmonary disease. Electronically Signed   By: Duanne Guess D.O.   On: 08/09/2021 19:03    Procedures Procedures (including critical care time)  Medications Ordered in UC Medications - No data to display  Initial Impression / Assessment and Plan / UC Course  I have reviewed the triage vital signs and the nursing notes.  Pertinent labs & imaging results that were available during my care of the patient were reviewed by me and considered in my medical decision making (see chart for details).      EKG obtained showed normal sinus rhythm with ventricular rate of 97 bpm with early R wave progression compared to 02/25/2019 tracing and no ischemic changes.  Chest x-ray was normal.  UA showed glucosuria with trace leukocyte Estrace.  Patient is not having any urinary tract infection symptoms and believe that leukocyte esterase is related to irritation from glucosuria.  Urine culture was obtained but we will defer  antibiotics until culture results are available.  CBC, CMP, CK obtained today-results pending.  Discussed that at least fatigue symptoms could be related to hyperglycemia has random glucose was 336 today.  Patient reports he has been without his medication as he is not currently followed by PCP so refills of previously prescribed medications were sent to pharmacy per his request.  Discussed that he should avoid taking statin until myalgias symptoms have resolved.  Recommended that he drink plenty of fluids including electrolyte solution.  He was prescribed Zanaflex to be used up to 3 times a day as needed with instruction not to drive or drink alcohol while taking it.  You can use over-the-counter medications for additional symptom relief.  Discussed alarm symptoms that warrant emergent evaluation including persistent pain, fever, nausea, vomiting, shortness of breath.  Discussed that he should have a low threshold for going to the emergency room if symptoms or not improving.  Strict return precautions given to which he expressed understanding.  Final Clinical Impressions(s) / UC Diagnoses   Final diagnoses:  Body aches  Generalized weakness  Type 2 diabetes mellitus with hyperglycemia, without long-term current use of insulin (HCC)     Discharge Instructions      I am unsure what is causing your symptoms as her blood work is pending.  It does appear that your diabetes is not well controlled which could be contributing to some of your fatigue.  It is very important that you take your blood sugar on a regular basis and keep a log for evaluation of follow-up appointment and take all medications as directed.  Follow-up with your primary care provider as soon as possible.  If you develop increased shortness of breath, nausea/vomiting, worsening fatigue you need to go to the emergency room for further evaluation.  I did call in some medications to help with the body aches including ibuprofen and a muscle  relaxer.  Do  not take additional NSAIDs including aspirin, ibuprofen/Advil, naproxen/Aleve with this medication as it can cause stomach bleeding.  Do not drive or drink alcohol while taking tizanidine as this can make you sleepy.  Make sure you are drinking plenty of fluid including electrolyte solutions.  We will contact you if your blood work is abnormal.  As we discussed, follow-up with your primary care provider soon as possible and go to the emergency room if your symptoms are not improving or if you have any worsening symptoms.     ED Prescriptions     Medication Sig Dispense Auth. Provider   ibuprofen (ADVIL) 600 MG tablet Take 1 tablet (600 mg total) by mouth every 6 (six) hours as needed for moderate pain. 30 tablet Chandler Stofer K, PA-C   tiZANidine (ZANAFLEX) 4 MG capsule Take 1 capsule (4 mg total) by mouth 3 (three) times daily. 21 capsule Damel Querry K, PA-C   glipiZIDE (GLUCOTROL) 10 MG tablet Take 1 tablet (10 mg total) by mouth 2 (two) times daily. 30 tablet Devita Nies K, PA-C   metFORMIN (GLUCOPHAGE) 1000 MG tablet Take 1 tablet (1,000 mg total) by mouth 2 (two) times daily. 60 tablet Nyomie Ehrlich K, PA-C   lisinopril (ZESTRIL) 20 MG tablet Take 1 tablet (20 mg total) by mouth daily. 30 tablet Martine Bleecker, Noberto Retort, PA-C      PDMP not reviewed this encounter.   Jeani Hawking, PA-C 08/09/21 1919

## 2021-08-10 LAB — URINE CULTURE

## 2023-03-23 IMAGING — DX DG CHEST 2V
3 series · 3 of 3 positions shown · non-contrast
Comparison: 02/22/2019

CLINICAL DATA: Shortness of breath, fatigue

EXAM:
CHEST - 2 VIEW

[chest pa (1 of 2)]
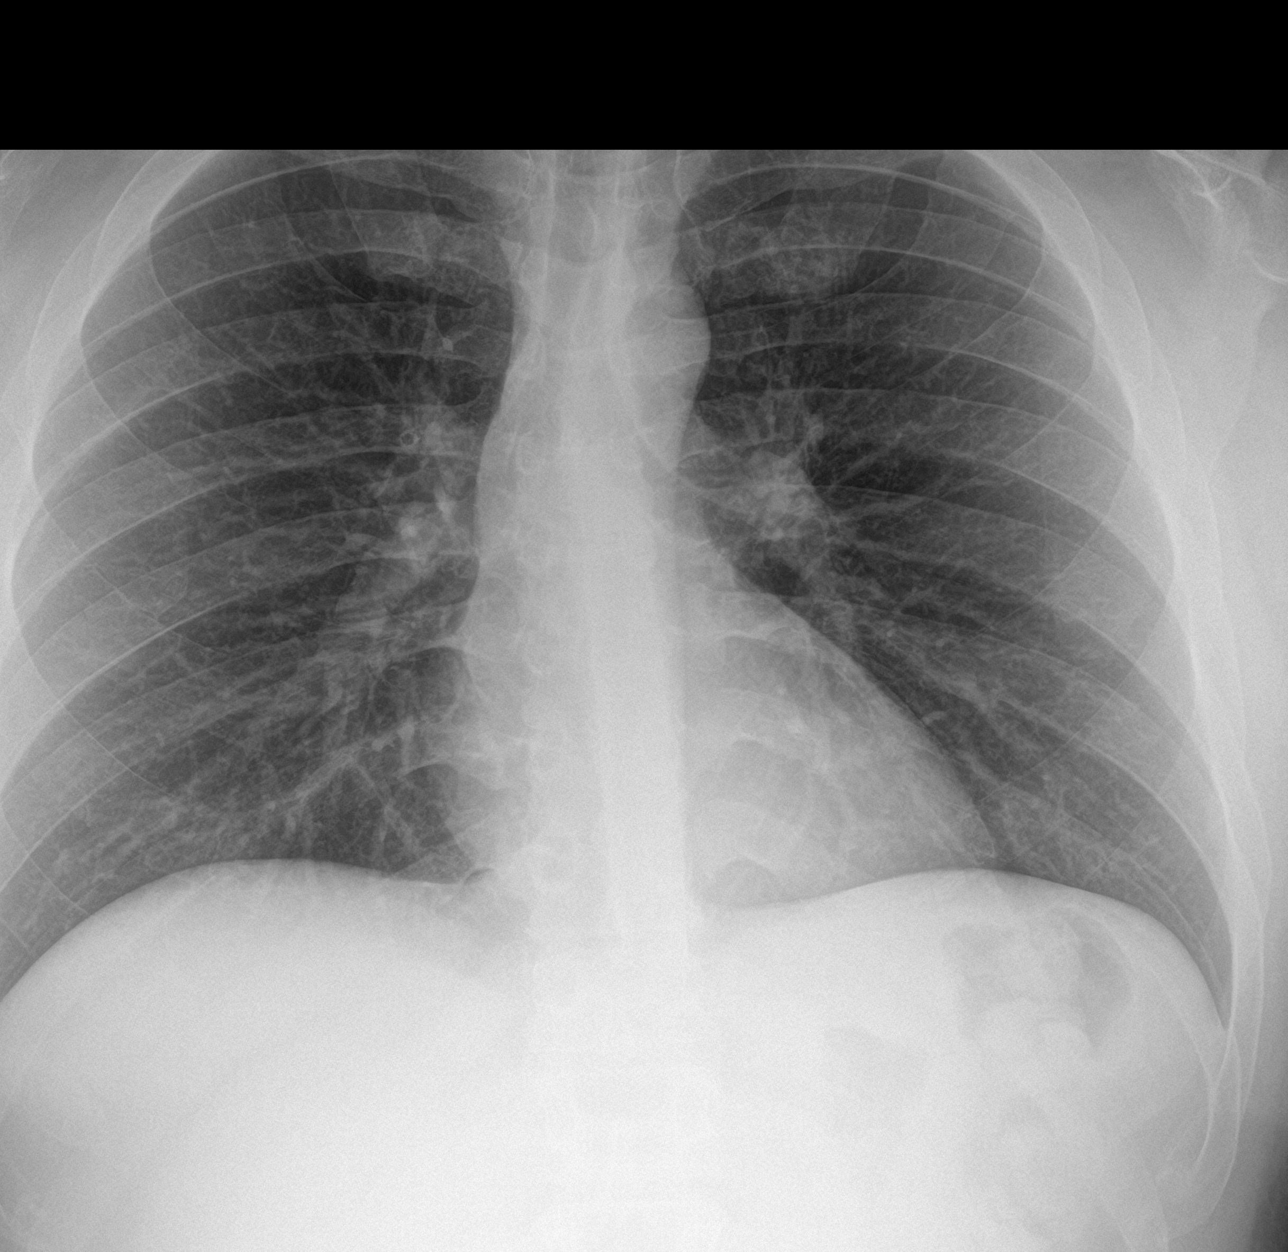

[chest pa (2 of 2)]
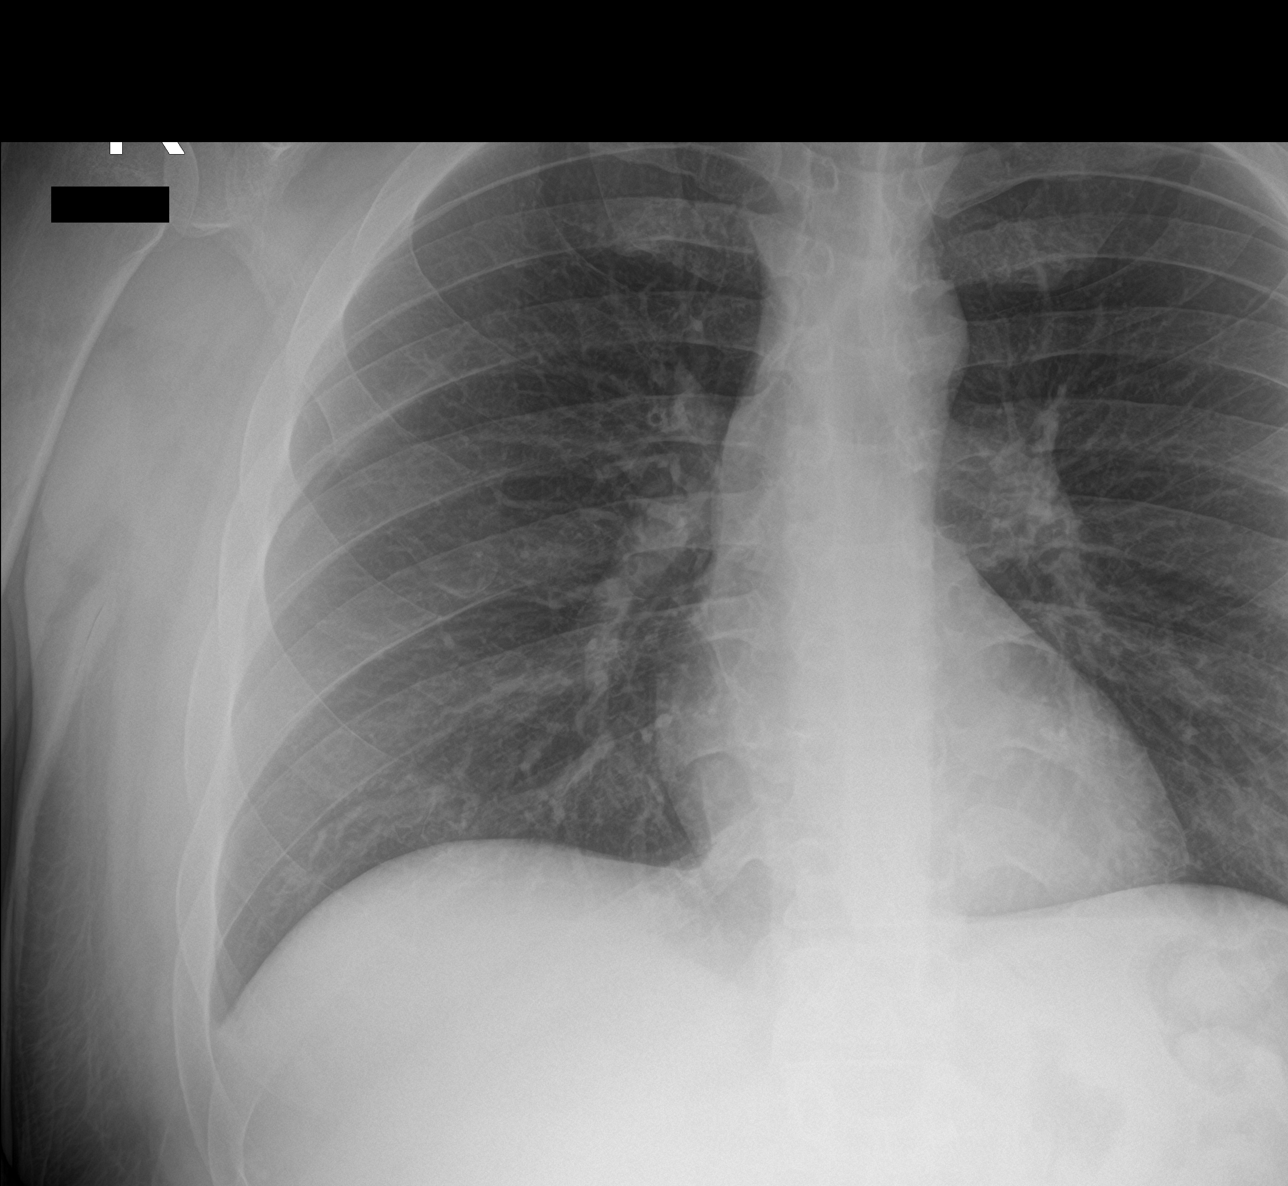

[chest lat]
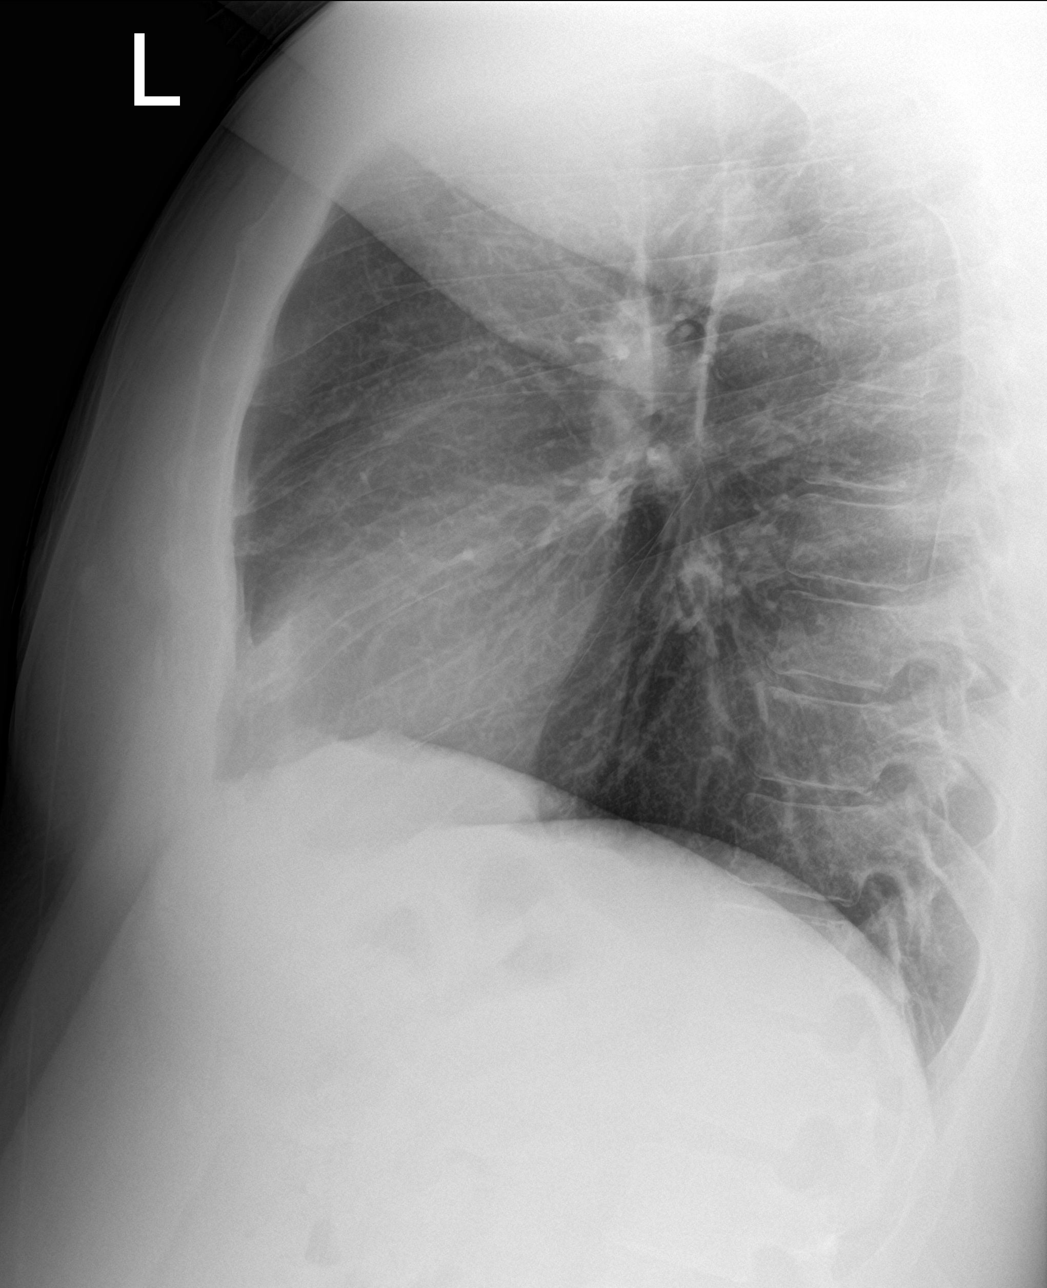

[3 of 3 positions shown; findings below may reference images not displayed]

FINDINGS: The heart size and mediastinal contours are within normal limits. No
focal airspace consolidation, pleural effusion, or pneumothorax. The
visualized skeletal structures are unremarkable.
IMPRESSION: No active cardiopulmonary disease.
